# Patient Record
Sex: Male | Born: 1990 | Race: White | Hispanic: No | Marital: Married | Smoking: Former smoker
Health system: Southern US, Community
[De-identification: ages and names within clinical notes are randomized; demographics above are authoritative.]

## PROBLEM LIST (undated history)

## (undated) DIAGNOSIS — J302 Other seasonal allergic rhinitis: Secondary | ICD-10-CM

## (undated) DIAGNOSIS — J45909 Unspecified asthma, uncomplicated: Secondary | ICD-10-CM

## (undated) HISTORY — PX: MOUTH SURGERY: SHX715

---
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
## 2008-10-14 ENCOUNTER — Emergency Department (HOSPITAL_COMMUNITY): Admission: EM | Admit: 2008-10-14 | Discharge: 2008-10-14 | Payer: Self-pay | Admitting: Emergency Medicine

## 2010-11-03 LAB — BASIC METABOLIC PANEL
BUN: 7 mg/dL (ref 6–23)
CO2: 27 mEq/L (ref 19–32)
Calcium: 9.5 mg/dL (ref 8.4–10.5)
Chloride: 100 mEq/L (ref 96–112)
Creatinine, Ser: 1.04 mg/dL (ref 0.4–1.5)
GFR calc Af Amer: >60 mL/min (ref >60)
GFR calc non Af Amer: >60 mL/min (ref >60)
Glucose, Bld: 97 mg/dL (ref 70–99)
Potassium: 3.4 mEq/L — ABNORMAL LOW (ref 3.5–5.1)
Sodium: 137 mEq/L (ref 135–145)

## 2010-11-03 LAB — DIFFERENTIAL
Basophils Absolute: 0.2 10*3/uL — ABNORMAL HIGH (ref 0.0–0.1)
Basophils Relative: 1 % (ref 0–1)
Eosinophils Absolute: 0.4 10*3/uL (ref 0.0–0.7)
Eosinophils Relative: 4 % (ref 0–5)
Lymphocytes Relative: 23 % (ref 12–46)
Lymphs Abs: 2.5 10*3/uL (ref 0.7–4.0)
Monocytes Absolute: 0.7 10*3/uL (ref 0.1–1.0)
Monocytes Relative: 6 % (ref 3–12)
Neutro Abs: 7.2 10*3/uL (ref 1.7–7.7)
Neutrophils Relative %: 66 % (ref 43–77)

## 2010-11-03 LAB — CBC
HCT: 42 % (ref 39.0–52.0)
Hemoglobin: 14.4 g/dL (ref 13.0–17.0)
MCHC: 34.2 g/dL (ref 30.0–36.0)
MCV: 87.5 fL (ref 78.0–100.0)
Platelets: 215 10*3/uL (ref 150–400)
RBC: 4.8 MIL/uL (ref 4.22–5.81)
RDW: 13.9 % (ref 11.5–15.5)
WBC: 11 10*3/uL — ABNORMAL HIGH (ref 4.0–10.5)

## 2012-11-08 ENCOUNTER — Encounter (HOSPITAL_COMMUNITY): Payer: Self-pay

## 2012-11-08 ENCOUNTER — Emergency Department (HOSPITAL_COMMUNITY)
Admission: EM | Admit: 2012-11-08 | Discharge: 2012-11-08 | Disposition: A | Discharge status: Home / Self Care | Payer: Self-pay | Attending: Emergency Medicine | Admitting: Emergency Medicine

## 2012-11-08 ENCOUNTER — Emergency Department (HOSPITAL_COMMUNITY): Payer: Self-pay

## 2012-11-08 DIAGNOSIS — L299 Pruritus, unspecified: Secondary | ICD-10-CM | POA: Insufficient documentation

## 2012-11-08 DIAGNOSIS — J45901 Unspecified asthma with (acute) exacerbation: Secondary | ICD-10-CM | POA: Insufficient documentation

## 2012-11-08 DIAGNOSIS — R21 Rash and other nonspecific skin eruption: Secondary | ICD-10-CM | POA: Insufficient documentation

## 2012-11-08 DIAGNOSIS — F17200 Nicotine dependence, unspecified, uncomplicated: Secondary | ICD-10-CM | POA: Insufficient documentation

## 2012-11-08 DIAGNOSIS — Z79899 Other long term (current) drug therapy: Secondary | ICD-10-CM | POA: Insufficient documentation

## 2012-11-08 DIAGNOSIS — R0789 Other chest pain: Secondary | ICD-10-CM | POA: Insufficient documentation

## 2012-11-08 DIAGNOSIS — L237 Allergic contact dermatitis due to plants, except food: Secondary | ICD-10-CM

## 2012-11-08 DIAGNOSIS — R0602 Shortness of breath: Secondary | ICD-10-CM | POA: Insufficient documentation

## 2012-11-08 DIAGNOSIS — L255 Unspecified contact dermatitis due to plants, except food: Secondary | ICD-10-CM | POA: Insufficient documentation

## 2012-11-08 HISTORY — DX: Other seasonal allergic rhinitis: J30.2

## 2012-11-08 HISTORY — DX: Unspecified asthma, uncomplicated: J45.909

## 2012-11-08 MED ORDER — PREDNISONE 10 MG PO TABS: ORAL_TABLET | ORAL | Status: DC

## 2012-11-08 MED ORDER — ALBUTEROL SULFATE (5 MG/ML) 0.5% IN NEBU
5.0000 mg | INHALATION_SOLUTION | Freq: Once | RESPIRATORY_TRACT | Status: AC
  Administered 2012-11-08: 5 mg via RESPIRATORY_TRACT
  Filled 2012-11-08: qty 1

## 2012-11-08 MED ORDER — ALBUTEROL SULFATE HFA 108 (90 BASE) MCG/ACT IN AERS: 1.0000 | INHALATION_SPRAY | Freq: Four times a day (QID) | RESPIRATORY_TRACT | Status: DC | PRN

## 2012-11-08 MED ORDER — ALBUTEROL SULFATE HFA 108 (90 BASE) MCG/ACT IN AERS
2.0000 | INHALATION_SPRAY | RESPIRATORY_TRACT | Status: DC | PRN
  Administered 2012-11-08: 2 via RESPIRATORY_TRACT
  Filled 2012-11-08: qty 6.7

## 2012-11-08 MED ORDER — IPRATROPIUM BROMIDE 0.02 % IN SOLN
0.5000 mg | Freq: Once | RESPIRATORY_TRACT | Status: AC
  Administered 2012-11-08: 0.5 mg via RESPIRATORY_TRACT
  Filled 2012-11-08: qty 2.5

## 2012-11-08 MED ORDER — PREDNISONE 50 MG PO TABS
60.0000 mg | ORAL_TABLET | Freq: Once | ORAL | Status: AC
  Administered 2012-11-08: 60 mg via ORAL
  Filled 2012-11-08: qty 1

## 2012-11-08 NOTE — ED Notes (Signed)
C/o rash to forehead and face and rt arm. Also c/o increase SOB with allergies. Speaks complete sentences without difficulty. nad noted.

## 2012-11-12 NOTE — ED Provider Notes (Signed)
History     CSN: [id number]  Arrival date & time 11/08/12  7829   First MD Initiated Contact with Patient 11/08/12 [phone]      Chief Complaint  Patient presents with  . Rash  . Asthma    (Consider location/radiation/quality/duration/timing/severity/associated sxs/prior treatment) HPI Comments: Cole Fields is a 22 y.o. Male presenting with asthma flare which he believes is due to increased pollen, as he reports infrequent episodes of asthma and no longer has an albuterol inhaler.  Symptoms including wheezing, chest tightness and shortness of breath,  Gradually worsening over the past several days.  He also reports coming in contact with poison ivy and now has an itchy rash on his forehead.  He has tried no medications or treatments for either complaint and has found no alleviators.  Exertion makes his breathing worse. He denies chest pain.     The history is provided by the patient.    Past Medical History  Diagnosis Date  . Asthma   . Seasonal allergies     Past Surgical History  Procedure Laterality Date  . Mouth surgery      No family history on file.  History  Substance Use Topics  . Smoking status: Current Every Day Smoker  . Smokeless tobacco: Not on file  . Alcohol Use: Yes      Review of Systems  Constitutional: Negative for fever.  HENT: Negative for congestion, sore throat and neck pain.   Eyes: Negative.   Respiratory: Positive for chest tightness, shortness of breath and wheezing. Negative for cough.   Cardiovascular: Negative for chest pain.  Gastrointestinal: Negative for nausea and abdominal pain.  Genitourinary: Negative.   Musculoskeletal: Negative for joint swelling and arthralgias.  Skin: Positive for rash. Negative for wound.  Neurological: Negative for dizziness, weakness, light-headedness, numbness and headaches.  Psychiatric/Behavioral: Negative.     Allergies  Review of patient's allergies indicates no known allergies.  Home  Medications   Current Outpatient Rx  Name  Route  Sig  Dispense  Refill  . Tetrahydroz-Glyc-Hyprom-PEG (VISINE MAXIMUM REDNESS RELIEF OP)   Ophthalmic   Apply 1 drop to eye daily as needed (allergy relief).         Marland Kitchen albuterol (PROVENTIL HFA;VENTOLIN HFA) 108 (90 BASE) MCG/ACT inhaler   Inhalation   Inhale 1-2 puffs into the lungs every 6 (six) hours as needed for wheezing.   1 Inhaler   0   . predniSONE (DELTASONE) 10 MG tablet      6, 5, 4, 3, 2 then 1 tablet by mouth daily for 6 days total.   21 tablet   0     BP 142/103  Pulse 103  Temp(Src) 98.9 F (37.2 C)  Resp 22  Ht 5\' 8"  (1.727 m)  SpO2 93%  Physical Exam  Nursing note and vitals reviewed. Constitutional: He appears well-developed and well-nourished. No distress.  HENT:  Head: Normocephalic and atraumatic.  Eyes: Conjunctivae are normal.  Neck: Normal range of motion. Neck supple.  Cardiovascular: Normal rate, regular rhythm, normal heart sounds and intact distal pulses.   Pulmonary/Chest: Effort normal. He has wheezes. He has no rhonchi.  Expiratory wheezes throughout all lung fields with prolonged expirations.  Abdominal: Soft. Bowel sounds are normal. There is no tenderness.  Musculoskeletal: Normal range of motion. He exhibits no edema.  Neurological: He is alert.  Skin: Skin is warm and dry. Rash noted. Rash is vesicular.  Several patches of vesicular rash on forehead,  No drainage,  Vesicles are intact.    Psychiatric: He has a normal mood and affect.    ED Course  Procedures (including critical care time)  Pt given albuterol and atrovent neb with complete resolution of wheezing,  Lungs clear with adequate aeration.   Labs Reviewed - No data to display No results found.   1. Asthma attack   2. Contact dermatitis due to poison ivy       MDM  Pt placed on prednisone taper, prescribed albuterol mdi.  Pt no respiratory distress at time of dc.   Patients labs and/or radiological studies  were viewed and considered during the medical decision making and disposition process.         Burgess Amor, PA-C 11/12/12 2146

## 2012-11-14 NOTE — ED Provider Notes (Signed)
Medical screening examination/treatment/procedure(s) were performed by non-physician practitioner and as supervising physician I was immediately available for consultation/collaboration.   Ajia Chadderdon M Ingvald Theisen, DO 11/14/12 1652 

## 2014-09-16 ENCOUNTER — Emergency Department (HOSPITAL_COMMUNITY)
Admission: EM | Admit: 2014-09-16 | Discharge: 2014-09-16 | Disposition: A | Discharge status: Home / Self Care | Payer: Self-pay | Attending: Emergency Medicine | Admitting: Emergency Medicine

## 2014-09-16 ENCOUNTER — Encounter (HOSPITAL_COMMUNITY): Payer: Self-pay | Admitting: Emergency Medicine

## 2014-09-16 ENCOUNTER — Emergency Department (HOSPITAL_COMMUNITY): Payer: Self-pay

## 2014-09-16 DIAGNOSIS — S0990XA Unspecified injury of head, initial encounter: Secondary | ICD-10-CM | POA: Insufficient documentation

## 2014-09-16 DIAGNOSIS — S0081XA Abrasion of other part of head, initial encounter: Secondary | ICD-10-CM | POA: Insufficient documentation

## 2014-09-16 DIAGNOSIS — Z7952 Long term (current) use of systemic steroids: Secondary | ICD-10-CM | POA: Insufficient documentation

## 2014-09-16 DIAGNOSIS — S1083XA Contusion of other specified part of neck, initial encounter: Secondary | ICD-10-CM | POA: Insufficient documentation

## 2014-09-16 DIAGNOSIS — J45909 Unspecified asthma, uncomplicated: Secondary | ICD-10-CM | POA: Insufficient documentation

## 2014-09-16 DIAGNOSIS — S46012A Strain of muscle(s) and tendon(s) of the rotator cuff of left shoulder, initial encounter: Secondary | ICD-10-CM | POA: Insufficient documentation

## 2014-09-16 DIAGNOSIS — Y9389 Activity, other specified: Secondary | ICD-10-CM | POA: Insufficient documentation

## 2014-09-16 DIAGNOSIS — Z23 Encounter for immunization: Secondary | ICD-10-CM | POA: Insufficient documentation

## 2014-09-16 DIAGNOSIS — Z79899 Other long term (current) drug therapy: Secondary | ICD-10-CM | POA: Insufficient documentation

## 2014-09-16 DIAGNOSIS — T148XXA Other injury of unspecified body region, initial encounter: Secondary | ICD-10-CM

## 2014-09-16 DIAGNOSIS — Y998 Other external cause status: Secondary | ICD-10-CM | POA: Insufficient documentation

## 2014-09-16 DIAGNOSIS — Y92410 Unspecified street and highway as the place of occurrence of the external cause: Secondary | ICD-10-CM | POA: Insufficient documentation

## 2014-09-16 DIAGNOSIS — S46011A Strain of muscle(s) and tendon(s) of the rotator cuff of right shoulder, initial encounter: Secondary | ICD-10-CM | POA: Insufficient documentation

## 2014-09-16 DIAGNOSIS — R0789 Other chest pain: Secondary | ICD-10-CM

## 2014-09-16 DIAGNOSIS — S29011A Strain of muscle and tendon of front wall of thorax, initial encounter: Secondary | ICD-10-CM | POA: Insufficient documentation

## 2014-09-16 MED ORDER — TRAMADOL HCL 50 MG PO TABS: 50.0000 mg | ORAL_TABLET | Freq: Four times a day (QID) | ORAL | Status: DC | PRN

## 2014-09-16 MED ORDER — TETANUS-DIPHTH-ACELL PERTUSSIS 5-2.5-18.5 LF-MCG/0.5 IM SUSP
INTRAMUSCULAR | Status: AC
  Filled 2014-09-16: qty 0.5

## 2014-09-16 MED ORDER — TETANUS-DIPHTH-ACELL PERTUSSIS 5-2.5-18.5 LF-MCG/0.5 IM SUSP
0.5000 mL | Freq: Once | INTRAMUSCULAR | Status: AC
  Administered 2014-09-16: 0.5 mL via INTRAMUSCULAR

## 2014-09-16 MED ORDER — DIAZEPAM 5 MG PO TABS
5.0000 mg | ORAL_TABLET | Freq: Once | ORAL | Status: AC
  Administered 2014-09-16: 5 mg via ORAL
  Filled 2014-09-16: qty 1

## 2014-09-16 MED ORDER — OXYCODONE-ACETAMINOPHEN 5-325 MG PO TABS
2.0000 | ORAL_TABLET | Freq: Once | ORAL | Status: AC
  Administered 2014-09-16: 2 via ORAL
  Filled 2014-09-16: qty 2

## 2014-09-16 MED ORDER — NAPROXEN 500 MG PO TABS: 500.0000 mg | ORAL_TABLET | Freq: Two times a day (BID) | ORAL | Status: DC | PRN
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
## 2015-02-01 ENCOUNTER — Emergency Department (HOSPITAL_COMMUNITY)
Admission: EM | Admit: 2015-02-01 | Discharge: 2015-02-01 | Disposition: A | Discharge status: Home / Self Care | Payer: Self-pay | Attending: Emergency Medicine | Admitting: Emergency Medicine

## 2015-02-01 ENCOUNTER — Encounter (HOSPITAL_COMMUNITY): Payer: Self-pay | Admitting: Emergency Medicine

## 2015-02-01 DIAGNOSIS — Z7952 Long term (current) use of systemic steroids: Secondary | ICD-10-CM | POA: Insufficient documentation

## 2015-02-01 DIAGNOSIS — K088 Other specified disorders of teeth and supporting structures: Secondary | ICD-10-CM | POA: Insufficient documentation

## 2015-02-01 DIAGNOSIS — Z79899 Other long term (current) drug therapy: Secondary | ICD-10-CM | POA: Insufficient documentation

## 2015-02-01 DIAGNOSIS — R220 Localized swelling, mass and lump, head: Secondary | ICD-10-CM

## 2015-02-01 DIAGNOSIS — J45909 Unspecified asthma, uncomplicated: Secondary | ICD-10-CM | POA: Insufficient documentation

## 2015-02-01 DIAGNOSIS — K0889 Other specified disorders of teeth and supporting structures: Secondary | ICD-10-CM

## 2015-02-01 DIAGNOSIS — Z720 Tobacco use: Secondary | ICD-10-CM | POA: Insufficient documentation

## 2015-02-01 MED ORDER — TRAMADOL HCL 50 MG PO TABS: 50.0000 mg | ORAL_TABLET | Freq: Four times a day (QID) | ORAL | Status: DC | PRN

## 2015-02-01 MED ORDER — CLINDAMYCIN HCL 150 MG PO CAPS: 150.0000 mg | ORAL_CAPSULE | Freq: Four times a day (QID) | ORAL | Status: DC

## 2015-02-01 MED ORDER — TRAMADOL HCL 50 MG PO TABS
50.0000 mg | ORAL_TABLET | Freq: Once | ORAL | Status: AC
  Administered 2015-02-01: 50 mg via ORAL
  Filled 2015-02-01: qty 1

## 2015-02-01 MED ORDER — KETOROLAC TROMETHAMINE 60 MG/2ML IM SOLN
60.0000 mg | Freq: Once | INTRAMUSCULAR | Status: AC
  Administered 2015-02-01: 60 mg via INTRAMUSCULAR
  Filled 2015-02-01: qty 2

## 2015-02-01 NOTE — ED Notes (Signed)
MD at bedside. 

## 2015-02-01 NOTE — Discharge Instructions (Signed)
If you were given medicines take as directed.  If you are on coumadin or contraceptives realize their levels and effectiveness is altered by many different medicines.  If you have any reaction (rash, tongues swelling, other) to the medicines stop taking and see a physician.    If your blood pressure was elevated in the ER make sure you follow up for management with a primary doctor or return for chest pain, shortness of breath or stroke symptoms.  Please follow up as directed and return to the ER or see a physician for new or worsening symptoms.  Thank you. Filed Vitals:   02/01/15 0713  BP: 151/101  Pulse: 82  Temp: 97.8 F (36.6 C)  TempSrc: Oral  Resp: 17  Height: 5\' 8"  (1.727 m)  Weight: 315 lb (142.883 kg)  SpO2: 100%    Emergency Department Resource Guide 1) Find a Doctor and Pay Out of Pocket Although you won't have to find out who is covered by your insurance plan, it is a good idea to ask around and get recommendations. You will then need to call the office and see if the doctor you have chosen will accept you as a new patient and what types of options they offer for patients who are self-pay. Some doctors offer discounts or will set up payment plans for their patients who do not have insurance, but you will need to ask so you aren't surprised when you get to your appointment.  2) Contact Your Local Health Department Not all health departments have doctors that can see patients for sick visits, but many do, so it is worth a call to see if yours does. If you don't know where your local health department is, you can check in your phone book. The CDC also has a tool to help you locate your state's health department, and many state websites also have listings of all of their local health departments.  3) Find a Walk-in Clinic If your illness is not likely to be very severe or complicated, you may want to try a walk in clinic. These are popping up all over the country in pharmacies,  drugstores, and shopping centers. They're usually staffed by nurse practitioners or physician assistants that have been trained to treat common illnesses and complaints. They're usually fairly quick and inexpensive. However, if you have serious medical issues or chronic medical problems, these are probably not your best option.  No Primary Care Doctor: - Call Health Connect at  [phone] - they can help you locate a primary care doctor that  accepts your insurance, provides certain services, etc. - Physician Referral Service- [phone]  Chronic Pain Problems: Organization         Address  Phone   Notes  [facility]  [phone] Patients need to be referred by their primary care doctor.   Medication Assistance: Organization         Address  Phone   Notes  [facility] Medication [facility] [address] [place] [place], [place] [address] [phone] --Must be a resident of [facility] -- Must have NO insurance coverage whatsoever (no Medicaid/ Medicare, etc.) -- The pt. MUST have a primary care doctor that directs their care regularly and follows them in the community   [facility]  [phone]   [employer]  [phone]    Agencies that provide inexpensive medical care: Retail buyer  Notes  [facility]  [phone]   [facility]    [phone]   [facility] [address] [place], [place] [address] [phone]   [facility] [address], [place] [phone]   Planned Parenthood    [phone]   [facility]    [phone]   [facility]  [address], [place] Phone:  [phone], Fax:  [phone] Hours of Operation:  9 am - 6 pm, M-F.  Also accepts Medicaid/Medicare and self-pay.  [facility]  [address], [place] Phone:  [phone], Fax: [phone]. Hours of Operation:  8:30 am - 5:30 pm, M-F.  Also accepts Medicaid and self-pay.  [facility] [place] [address] [place] Phone: [phone]   [facility] [address] Natasha Bence [place], [place] [phone], Ext. [phone] Mondays & Thursdays: 7-9 AM.  First 15 patients are seen on a first come, first serve basis.    Medicaid-accepting [facility] Providers:  Organization         Address  Phone   Notes  [facility] [address], [place] [phone] Also accepts self-pay patients.  [facility] [address] Laurell Josephs [place], [place]  [phone]   [facility] [address], [place] [phone]   [facility] [address] [place] [phone]   Renaye Rakers [address], [place]   [phone] Only accepts [place] patients after they have their name applied to their card.   Self-Pay (no insurance) in [facility]:  Organization         Address  Phone   Notes  Sickle Cell Patients, [facility] [address] [place], [place] [phone]   [facility] [address] [place], [place] [phone]   [facility] [place]  [address], [place] [phone]   [facility]/Dr. Osei-Bonsu  [address] [place] or [address], [place] [phone] Phone number for both [place] and [place] locations is the same.  [facility]gent Medical and [facility] [address], [place] [phone]   [facility] [address] [place] or [address] [phone]   [facility] [address] [place] [phone], phone; [phone], fax Sees patients 1st and 3rd Saturday of every month.  Must not qualify for public  or private insurance (i.e. Medicaid, Medicare, [place] Health Choice, Veterans' Benefits)  Household income should be no more than 200% of the poverty level The clinic cannot treat you if you are pregnant or think you are pregnant  Sexually transmitted diseases are not treated at the clinic.    Dental Care: Organization         Address  Phone  Notes  [facility] Department of [facility] [address] [place], [place] [phone] Accepts children up to age 33 who are enrolled in [place] or [place] Health Choice; pregnant women with a Medicaid card; and children who have applied for Medicaid or [place] Health Choice, but were declined, whose parents can pay a reduced fee at time of service.  [facility]  Department of [facility]  [address], [place] [phone] Accepts children up to age 93 who are enrolled in [place] or [place] Health Choice; pregnant women with a Medicaid card; and children who have applied for Medicaid or [place] Health Choice, but were declined, whose parents can pay a reduced fee at time of service.  [facility]  [address] [place], [place] [phone] Patients are seen by appointment only. Walk-ins are not accepted. [facility] will see patients 53 years of age and older. Monday - Tuesday (8am-5pm) Most Wednesdays (8:30-5pm) $30 per visit, cash only  [facility]  [address], [facility] [phone] Patients are seen by appointment only. Walk-ins are not accepted. [facility] will see patients 75 years of age and older. One Wednesday Evening (Monthly: Volunteer Based).  $30 per visit, cash only  [employer]  [phone] for adults; Children under age 12, call Graduate Pediatric Dentistry at [phone]. Children aged 59-14, please call [phone] to request a pediatric application.  Dental services are provided in all areas of  dental care including fillings, crowns and bridges, complete and partial dentures, implants, gum treatment, root canals, and extractions. Preventive care is also provided. Treatment is provided to both adults and children. Patients are selected via a lottery and there is often a waiting list.   [facility] [address] [place]  [phone] [email or web address]   [facility] [address] [place], [place] [phone], Ext. [phone] Second and Fourth Thursday of each month, opens at 6:30 AM; Clinic ends at 9 AM.  Patients are seen on a first-come first-served basis, and a limited number are seen during each clinic.   [facility]  [address] [place], [place] [phone]   Eligibility Requirements You must have lived in [place], [place], or [place] counties for at least the last three months.   You cannot be eligible for state or federal sponsored [place], including [employer], [place], or [employer].   You generally cannot be eligible for healthcare insurance through your employer.    How to apply: Eligibility screenings are held every Tuesday and Wednesday afternoon from 1:00 pm until 4:00 pm. You do not need an appointment for the interview!  [facility] [address] [place], [place] [phone]   [facility] Health [facility]  [phone]   [facility] Health Department  [phone]   [facility]  [phone]    Behavioral Health Resources in the Community: Intensive Outpatient Programs Organization         Address  Phone  Notes  [facility] [address], [place], [place] [phone]   [facility] [address] [place], [place] [phone]   [facility] [address], [place], [place]  [phone]   [facility] [address],  [place], [place] [phone] or  [phone]   Substance Abuse Resources Organization         Address  Phone  Notes  [facility] Drug Services  [phone]   [facility]  [phone]   [facility]  [phone]   Floydene Flock  [phone]   Residential & Outpatient Substance Abuse Program  [phone]   Psychological Services Organization         Address  Phone  Notes  [facility]  [phone]- [phone]   [facility]  [phone]- [phone]   [facility] [address], [place] [phone] or [phone]    Mobile Crisis Teams Organization         Address  Phone  Notes  [facility], Mobile Crisis Care Unit  [phone]   [facility]  [address] [place], [place] [phone]   Doristine LocksSharon DeEsch [address] [place] [phone]    Self-Help/Support Groups Organization         Address  Phone             Notes  [facility] [place] - variety of support groups  [phone]- [phone] Call for more information  Narcotics Anonymous (NA), [facility] [address], [place]  2 meetings at this location   Statisticianesidential Treatment Programs Organization         Address  Phone  Notes  [facility] [address],    [place]  [phone]   [facility]  [address], [place] [phone], [place], [place] [phone]   [facility] [address], [place] [phone] Admissions: 8am-3pm M-F  [facility] [address]    [place], [place] [phone]   [facility] [address], [place], [place] [phone]   [facility] [address]  [place], [place] [phone]   [facility] - Intensive Outpatient [address], [place], [place] [phone]   [facility] ([facility] [address]  [place], [place] [phone] or [phone]   [facility]  [facility] Services ([facility]) [address] [place], [place] [phone] Accepts Medicaid  [facility] [address]  [place] [phone] Substance Abuse/Addiction Treatment   [facility] Behavioral Health Resources Organization         Address  Phone  Notes  [facility]  [phone]) [phone]   Angie FavaJulie Brannon, PhD [address] [place], [place]   [phone](336) [phone] or [phone](336) [phone]   [facility]   [address] [place] [address] [place], [place] [phone](336) [phone]   [facility] [address], [place], [place] [phone]) [phone] Insurance/Medicaid/sponsorship through [facility]  [facility] Families [address]                                    [place], [place] [phone]) [phone] Therapy/tele-psych/case  [facility] [address]   [place], [place] [phone](336) [phone]    Dr. Lolly MustacheArfeen  [phone]) [phone]   [facility]  [employer] [facility] 1) [address],  2) [address], [place] 3)  [address], [place] [phone](336) [phone](336) [phone]  [phone](336) [phone]   [facility] Child Abuse Hotline [phone](336) [phone] or [phone](336) [phone] (After Hours)

## 2015-02-01 NOTE — ED Notes (Signed)
Pt states that he woke up this morning with pain and swelling on top left of mouth.  States took Tylenol at 0400 and again at 0630 as well as "a few shots of liquor" for the pain.

## 2015-02-01 NOTE — ED Provider Notes (Signed)
CSN: [id number]     Arrival date & time 02/01/15  [id number] History   First MD Initiated Contact with Patient 02/01/15 0744     Chief Complaint  Patient presents with  . Oral Swelling     (Consider location/radiation/quality/duration/timing/severity/associated sxs/prior Treatment) HPI Comments: 24 year old male denies medical hx, no allergies to medicine (vomiting with ibuprofen), smoker presents with left upper tooth pain and facial swelling.  Tender to palpation, pt waiting to try and see a dentist.  No fevers The history is provided by the patient.    Past Medical History  Diagnosis Date  . Asthma   . Seasonal allergies    Past Surgical History  Procedure Laterality Date  . Mouth surgery     History reviewed. No pertinent family history. History  Substance Use Topics  . Smoking status: Current Every Day Smoker -- 0.50 packs/day    Types: Cigarettes  . Smokeless tobacco: Not on file  . Alcohol Use: Yes    Review of Systems  Constitutional: Negative for fever and chills.  HENT: Positive for dental problem and facial swelling.   Respiratory: Negative for shortness of breath.       Allergies  Ibuprofen  Home Medications   Prior to Admission medications   Medication Sig Start Date End Date Taking? Authorizing Provider  acetaminophen (TYLENOL) 500 MG tablet Take 1,000 mg by mouth every 8 (eight) hours as needed (pain).    Historical Provider, MD  albuterol (PROVENTIL HFA;VENTOLIN HFA) 108 (90 BASE) MCG/ACT inhaler Inhale 1-2 puffs into the lungs every 6 (six) hours as needed for wheezing. Patient not taking: Reported on 09/16/2014 11/08/12   Burgess AmorJulie Idol, PA-C  clindamycin (CLEOCIN) 150 MG capsule Take 1 capsule (150 mg total) by mouth every 6 (six) hours. 02/01/15   Blane OharaJoshua Deneen Slager, MD  loratadine (CLARITIN) 10 MG tablet Take 10 mg by mouth daily.    Historical Provider, MD  naproxen (NAPROSYN) 500 MG tablet Take 1 tablet (500 mg total) by mouth 2 (two) times daily as needed for  moderate pain. 09/16/14   Raeford RazorStephen Kohut, MD  predniSONE (DELTASONE) 10 MG tablet 6, 5, 4, 3, 2 then 1 tablet by mouth daily for 6 days total. Patient not taking: Reported on 09/16/2014 11/08/12   Burgess AmorJulie Idol, PA-C  traMADol (ULTRAM) 50 MG tablet Take 1 tablet (50 mg total) by mouth every 6 (six) hours as needed. 09/16/14   Raeford RazorStephen Kohut, MD  traMADol (ULTRAM) 50 MG tablet Take 1 tablet (50 mg total) by mouth every 6 (six) hours as needed. 02/01/15   Blane OharaJoshua Jaimie Pippins, MD   BP 151/101 mmHg  Pulse 82  Temp(Src) 97.8 F (36.6 C) (Oral)  Resp 17  Ht 5\' 8"  (1.727 m)  Wt 315 lb (142.883 kg)  BMI 47.91 kg/m2  SpO2 100% Physical Exam  Constitutional: He appears well-developed and well-nourished. No distress.  HENT:  Head: Normocephalic and atraumatic.  Tender left upper gingiva, very mild left facial swelling, no induration, neck supple, No trismus, uvular deviation, unilateral posterior pharyngeal edema or submandibular swelling.   Neck: Normal range of motion. Neck supple.  Pulmonary/Chest: Effort normal and breath sounds normal.  Nursing note and vitals reviewed.   ED Course  Procedures (including critical care time) Labs Review Labs Reviewed - No data to display  Imaging Review No results found.   EKG Interpretation None      MDM   Final diagnoses:  Pain, dental  Left facial swelling   Well appearing, no emergent ENT infection  on exam.   Pt tolerated toradol and tramadol well.  Results and differential diagnosis were discussed with the patient/parent/guardian. Xrays were independently reviewed by myself.  Close follow up outpatient was discussed, comfortable with the plan.   Medications  ketorolac (TORADOL) injection 60 mg (60 mg Intramuscular Given 02/01/15 0846)  traMADol (ULTRAM) tablet 50 mg (50 mg Oral Given 02/01/15 0847)    Filed Vitals:   02/01/15 0713 02/01/15 0848  BP: 151/101 133/101  Pulse: 82 65  Temp: 97.8 F (36.6 C) 97.3 F (36.3 C)  TempSrc: Oral Oral   Resp: 17 18  Height:  (1.727 m)   Weight: 315 lb (142.883 kg)   SpO2: 100% 96%    Final diagnoses:  Pain, dental  Left facial swelling       Blane Ohara, MD 02/02/15 2208

## 2015-07-29 ENCOUNTER — Encounter (HOSPITAL_COMMUNITY): Payer: Self-pay

## 2015-07-29 ENCOUNTER — Emergency Department (HOSPITAL_COMMUNITY)
Admission: EM | Admit: 2015-07-29 | Discharge: 2015-07-29 | Disposition: A | Discharge status: Home / Self Care | Payer: Self-pay | Attending: Emergency Medicine | Admitting: Emergency Medicine

## 2015-07-29 DIAGNOSIS — Z79899 Other long term (current) drug therapy: Secondary | ICD-10-CM | POA: Insufficient documentation

## 2015-07-29 DIAGNOSIS — K649 Unspecified hemorrhoids: Secondary | ICD-10-CM

## 2015-07-29 DIAGNOSIS — J45909 Unspecified asthma, uncomplicated: Secondary | ICD-10-CM | POA: Insufficient documentation

## 2015-07-29 DIAGNOSIS — K644 Residual hemorrhoidal skin tags: Secondary | ICD-10-CM | POA: Insufficient documentation

## 2015-07-29 DIAGNOSIS — F17210 Nicotine dependence, cigarettes, uncomplicated: Secondary | ICD-10-CM | POA: Insufficient documentation

## 2015-07-29 MED ORDER — HYDROCORTISONE ACETATE 25 MG RE SUPP: 25.0000 mg | Freq: Two times a day (BID) | RECTAL | Status: DC

## 2015-07-29 NOTE — ED Provider Notes (Signed)
CSN: [id number]     Arrival date & time 07/29/15  1216 History   First MD Initiated Contact with Patient 07/29/15 1340     Chief Complaint  Patient presents with  . Hemorrhoids     (Consider location/radiation/quality/duration/timing/severity/associated sxs/prior Treatment) HPI Comments: Patient is a 25 year old male who presents to the emergency department with a complaint of hemorrhoids.  The patient states that he has had problems with hemorrhoids a couple times a year for some time now. He states usually he can get them to "go back and". This time he states that the hemorrhoid was more resistant going in. The patient had been bothered with a hemorrhoid for approximately 4 days, when it ruptured. The patient states that it seems as though it was difficult to get it stop bleeding. And he presents to the emergency department because of bleeding. He states however after being in the examination room that it seems though the blood has mostly subsided. The pain is still present, but much improved after the hemorrhoid ruptured. The patient has not had any fever or chills reported.  The history is provided by the patient.    Past Medical History  Diagnosis Date  . Asthma   . Seasonal allergies    Past Surgical History  Procedure Laterality Date  . Mouth surgery     No family history on file. Social History  Substance Use Topics  . Smoking status: Current Every Day Smoker -- 0.50 packs/day    Types: Cigarettes  . Smokeless tobacco: None  . Alcohol Use: Yes     Comment: occ    Review of Systems  Genitourinary: Negative for dysuria, discharge, penile swelling, scrotal swelling, difficulty urinating, penile pain and testicular pain.       Rectal pain  All other systems reviewed and are negative.     Allergies  Ibuprofen  Home Medications   Prior to Admission medications   Medication Sig Start Date End Date Taking? Authorizing Provider  acetaminophen (TYLENOL) 500 MG tablet  Take 1,000 mg by mouth every 8 (eight) hours as needed (pain).   Yes Historical Provider, MD  albuterol (PROVENTIL HFA;VENTOLIN HFA) 108 (90 BASE) MCG/ACT inhaler Inhale 1-2 puffs into the lungs every 6 (six) hours as needed for wheezing. 11/08/12  Yes Burgess AmorJulie Idol, PA-C  loratadine (CLARITIN) 10 MG tablet Take 10 mg by mouth daily.   Yes Historical Provider, MD  phenylephrine-shark liver oil-mineral oil-petrolatum (PREPARATION H) 0.25-3-14-71.9 % rectal ointment Place 1 application rectally 4 (four) times daily as needed for hemorrhoids.   Yes Historical Provider, MD  clindamycin (CLEOCIN) 150 MG capsule Take 1 capsule (150 mg total) by mouth every 6 (six) hours. Patient not taking: Reported on 07/29/2015 02/01/15   Blane OharaJoshua Zavitz, MD  traMADol (ULTRAM) 50 MG tablet Take 1 tablet (50 mg total) by mouth every 6 (six) hours as needed. Patient not taking: Reported on 07/29/2015 02/01/15   Blane OharaJoshua Zavitz, MD   BP 140/75 mmHg  Pulse 107  Temp(Src) 97.8 F (36.6 C) (Oral)  Resp 24  Ht 5\' 5"  (1.651 m)  Wt 134.083 kg  BMI 49.19 kg/m2  SpO2 100% Physical Exam  Constitutional: He is oriented to person, place, and time. He appears well-developed and well-nourished.  Non-toxic appearance.  HENT:  Head: Normocephalic.  Right Ear: Tympanic membrane and external ear normal.  Left Ear: Tympanic membrane and external ear normal.  Eyes: EOM and lids are normal. Pupils are equal, round, and reactive to light.  Neck: Normal range  of motion. Neck supple. Carotid bruit is not present.  Cardiovascular: Normal rate, regular rhythm, normal heart sounds, intact distal pulses and normal pulses.   Pulmonary/Chest: Breath sounds normal. No respiratory distress.  Abdominal: Soft. Bowel sounds are normal. There is no tenderness. There is no guarding.  Genitourinary: Rectal exam shows external hemorrhoid. Rectal exam shows no fissure.     Musculoskeletal: Normal range of motion.  Lymphadenopathy:       Head (right side):  No submandibular adenopathy present.       Head (left side): No submandibular adenopathy present.    He has no cervical adenopathy.  Neurological: He is alert and oriented to person, place, and time. He has normal strength. No cranial nerve deficit or sensory deficit.  Skin: Skin is warm and dry.  Psychiatric: He has a normal mood and affect. His speech is normal.  Nursing note and vitals reviewed.   ED Course  Procedures (including critical care time) Labs Review Labs Reviewed - No data to display  Imaging Review No results found. I have personally reviewed and evaluated these images and lab results as part of my medical decision-making.   EKG Interpretation None      MDM  Vital signs within normal limits. There is no evidence of abscess or red streaking in the rectal area. The patient has an external hemorrhoid present. The bleeding that was previously noted has stopped. The patient is ambulatory in the room and hall with minimal discomfort.  I discussed warm tub soaks/sitz bath with the patient. I prescribed Anusol HC suppositories 2 or 3 times daily. The patient is also given a referral to surgery for additional evaluation and management if not improving. We discussed the need for high fiber diet, and stool softener until this issue resolves. The patient is in agreement with this discharge plan. The patient will return to the emergency department immediately if bleeding returns, or his not able to be controlled, or the pain intensifies concerning his hemorrhoid.    Final diagnoses:  Hemorrhoids, unspecified hemorrhoid type    *I have reviewed nursing notes, vital signs, and all appropriate lab and imaging results for this patient.[address], PA-C 07/30/15 2341  Doug Sou, MD 08/02/15 [phone]

## 2015-07-29 NOTE — ED Notes (Signed)
Pt reports noticed a hemorrhoid New Years Eve and reports it ruptured last night and pt bleeding.

## 2015-07-29 NOTE — Discharge Instructions (Signed)
. Please continue with high-fiber diet. Warm tub soaks 2 or 3 times daily. Anusol 2 or 3 times daily. Please see Dr. Lovell Sheehan for surgical evaluation and management of this issue. Hemorrhoids Hemorrhoids are swollen veins around the rectum or anus. There are two types of hemorrhoids:   Internal hemorrhoids. These occur in the veins just inside the rectum. They may poke through to the outside and become irritated and painful.  External hemorrhoids. These occur in the veins outside the anus and can be felt as a painful swelling or hard lump near the anus. CAUSES  Pregnancy.   Obesity.   Constipation or diarrhea.   Straining to have a bowel movement.   Sitting for long periods on the toilet.  Heavy lifting or other activity that caused you to strain.  Anal intercourse. SYMPTOMS   Pain.   Anal itching or irritation.   Rectal bleeding.   Fecal leakage.   Anal swelling.   One or more lumps around the anus.  DIAGNOSIS  Your caregiver may be able to diagnose hemorrhoids by visual examination. Other examinations or tests that may be performed include:   Examination of the rectal area with a gloved hand (digital rectal exam).   Examination of anal canal using a small tube (scope).   A blood test if you have lost a significant amount of blood.  A test to look inside the colon (sigmoidoscopy or colonoscopy). TREATMENT Most hemorrhoids can be treated at home. However, if symptoms do not seem to be getting better or if you have a lot of rectal bleeding, your caregiver may perform a procedure to help make the hemorrhoids get smaller or remove them completely. Possible treatments include:   Placing a rubber band at the base of the hemorrhoid to cut off the circulation (rubber band ligation).   Injecting a chemical to shrink the hemorrhoid (sclerotherapy).   Using a tool to burn the hemorrhoid (infrared light therapy).   Surgically removing the hemorrhoid  (hemorrhoidectomy).   Stapling the hemorrhoid to block blood flow to the tissue (hemorrhoid stapling).  HOME CARE INSTRUCTIONS   Eat foods with fiber, such as whole grains, beans, nuts, fruits, and vegetables. Ask your doctor about taking products with added fiber in them (fibersupplements).  Increase fluid intake. Drink enough water and fluids to keep your urine clear or pale yellow.   Exercise regularly.   Go to the bathroom when you have the urge to have a bowel movement. Do not wait.   Avoid straining to have bowel movements.   Keep the anal area dry and clean. Use wet toilet paper or moist towelettes after a bowel movement.   Medicated creams and suppositories may be used or applied as directed.   Only take over-the-counter or prescription medicines as directed by your caregiver.   Take warm sitz baths for 15-20 minutes, 3-4 times a day to ease pain and discomfort.   Place ice packs on the hemorrhoids if they are tender and swollen. Using ice packs between sitz baths may be helpful.   Put ice in a plastic bag.   Place a towel between your skin and the bag.   Leave the ice on for 15-20 minutes, 3-4 times a day.   Do not use a donut-shaped pillow or sit on the toilet for long periods. This increases blood pooling and pain.  SEEK MEDICAL CARE IF:  You have increasing pain and swelling that is not controlled by treatment or medicine.  You have uncontrolled bleeding.  You have difficulty or you are unable to have a bowel movement.  You have pain or inflammation outside the area of the hemorrhoids. MAKE SURE YOU:  Understand these instructions.  Will watch your condition.  Will get help right away if you are not doing well or get worse.   This information is not intended to replace advice given to you by your health care provider. Make sure you discuss any questions you have with your health care provider.   Document Released: 07/07/2000 Document  Revised: 06/26/2012 Document Reviewed: 05/14/2012 Elsevier Interactive Patient Education [employer]2016 [employer]
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
## 2017-04-20 ENCOUNTER — Encounter (HOSPITAL_COMMUNITY): Payer: Self-pay | Admitting: *Deleted

## 2017-04-20 ENCOUNTER — Emergency Department (HOSPITAL_COMMUNITY)
Admission: EM | Admit: 2017-04-20 | Discharge: 2017-04-20 | Disposition: A | Discharge status: Home / Self Care | Payer: Self-pay | Attending: Emergency Medicine | Admitting: Emergency Medicine

## 2017-04-20 DIAGNOSIS — Z5321 Procedure and treatment not carried out due to patient leaving prior to being seen by health care provider: Secondary | ICD-10-CM | POA: Insufficient documentation

## 2017-04-20 LAB — URINALYSIS, ROUTINE W REFLEX MICROSCOPIC
Bilirubin Urine: NEGATIVE
Glucose, UA: NEGATIVE mg/dL
Hgb urine dipstick: NEGATIVE
KETONES UR: NEGATIVE mg/dL
LEUKOCYTES UA: NEGATIVE
NITRITE: NEGATIVE
PROTEIN: NEGATIVE mg/dL
Specific Gravity, Urine: 1.011 (ref 1.005–1.030)
pH: 8 (ref 5.0–8.0)

## 2017-04-20 NOTE — ED Triage Notes (Signed)
Pt with epigastric abd pain that started today, N/V started yesterday. LBM yesterday and hard stool.

## 2017-04-20 NOTE — ED Notes (Signed)
Pt not in waiting room when called.   

## 2018-05-15 ENCOUNTER — Emergency Department (HOSPITAL_COMMUNITY)
Admission: EM | Admit: 2018-05-15 | Discharge: 2018-05-15 | Disposition: A | Discharge status: Home / Self Care | Payer: Self-pay | Attending: Emergency Medicine | Admitting: Emergency Medicine

## 2018-05-15 ENCOUNTER — Other Ambulatory Visit: Payer: Self-pay

## 2018-05-15 ENCOUNTER — Encounter (HOSPITAL_COMMUNITY): Payer: Self-pay | Admitting: Emergency Medicine

## 2018-05-15 DIAGNOSIS — J45909 Unspecified asthma, uncomplicated: Secondary | ICD-10-CM | POA: Insufficient documentation

## 2018-05-15 DIAGNOSIS — R000 Tachycardia, unspecified: Secondary | ICD-10-CM | POA: Insufficient documentation

## 2018-05-15 DIAGNOSIS — Z79899 Other long term (current) drug therapy: Secondary | ICD-10-CM | POA: Insufficient documentation

## 2018-05-15 DIAGNOSIS — T401X1A Poisoning by heroin, accidental (unintentional), initial encounter: Secondary | ICD-10-CM | POA: Insufficient documentation

## 2018-05-15 DIAGNOSIS — Z87891 Personal history of nicotine dependence: Secondary | ICD-10-CM | POA: Insufficient documentation

## 2018-05-15 MED ORDER — SODIUM CHLORIDE 0.9 % IV SOLN: Freq: Once | INTRAVENOUS | Status: DC

## 2018-05-15 NOTE — ED Notes (Signed)
Pt  Has flat affect, not making eye contract.  Family at bedside.

## 2018-05-15 NOTE — ED Provider Notes (Signed)
[facility] EMERGENCY DEPARTMENT Provider Note   CSN: [id number] Arrival date & time: 05/15/18  1445     History   Chief Complaint Chief Complaint  Patient presents with  . Drug Overdose    HPI Cole Fields is a 27 y.o. male.  He is brought in by EMS after an apparent overdose.  He says he is snorted some heroin today and also used Suboxone.  He was at his grandfathers doing some chores and he passed out.  He woke finding his family members around him and had had a urinary incontinence.  He did not receive any Narcan.  Currently he is awake and alert and denies any medical complaints.  He denies any other ingestions and denies he was trying to hurt himself.    The history is provided by the patient.  Drug Overdose  This is a new problem. The current episode started less than 1 hour ago. The problem has been resolved. Pertinent negatives include no chest pain, no abdominal pain, no headaches and no shortness of breath. Nothing aggravates the symptoms. Nothing relieves the symptoms. He has tried nothing for the symptoms. The treatment provided no relief.    Past Medical History:  Diagnosis Date  . Asthma   . Seasonal allergies     There are no active problems to display for this patient.   Past Surgical History:  Procedure Laterality Date  . MOUTH SURGERY          Home Medications    Prior to Admission medications   Medication Sig Start Date End Date Taking? Authorizing Provider  acetaminophen (TYLENOL) 500 MG tablet Take 1,000 mg by mouth every 8 (eight) hours as needed (pain).    [provider]  albuterol (PROVENTIL HFA;VENTOLIN HFA) 108 (90 BASE) MCG/ACT inhaler Inhale 1-2 puffs into the lungs every 6 (six) hours as needed for wheezing. 11/08/12   Burgess Amor, PA-C  clindamycin (CLEOCIN) 150 MG capsule Take 1 capsule (150 mg total) by mouth every 6 (six) hours. Patient not taking: Reported on 07/29/2015 02/01/15   Blane Ohara, MD  hydrocortisone  (ANUSOL-HC) 25 MG suppository Place 1 suppository (25 mg total) rectally 2 (two) times daily. 07/29/15   Ivery Quale, PA-C  loratadine (CLARITIN) 10 MG tablet Take 10 mg by mouth daily.    [provider]  phenylephrine-shark liver oil-mineral oil-petrolatum (PREPARATION H) 0.25-3-14-71.9 % rectal ointment Place 1 application rectally 4 (four) times daily as needed for hemorrhoids.    [provider]  traMADol (ULTRAM) 50 MG tablet Take 1 tablet (50 mg total) by mouth every 6 (six) hours as needed. Patient not taking: Reported on 07/29/2015 02/01/15   Blane Ohara, MD    Family History History reviewed. No pertinent family history.  Social History Social History   Tobacco Use  . Smoking status: Former Smoker    Packs/day: 0.50    Types: Cigarettes    Last attempt to quit: 12/18/2016    Years since quitting: 1.4  . Smokeless tobacco: Current User    Types: Chew  Substance Use Topics  . Alcohol use: Yes    Comment: occ  . Drug use: Yes    Types: Marijuana    Comment: Heroin     Allergies   Ibuprofen   Review of Systems Review of Systems  Constitutional: Negative for fever.  HENT: Negative for sore throat.   Eyes: Negative for visual disturbance.  Respiratory: Negative for shortness of breath.   Cardiovascular: Negative for chest  pain.  Gastrointestinal: Negative for abdominal pain.  Genitourinary: Negative for dysuria.  Musculoskeletal: Negative for neck pain.  Skin: Negative for rash.  Neurological: Negative for headaches.     Physical Exam Updated Vital Signs BP (!) 150/101   Pulse (!) 132   Temp 98 F (36.7 C) (Oral)   Resp 20   Ht 5\' 10"  (1.778 m)   Wt 100 kg   SpO2 96%   BMI 31.63 kg/m   Physical Exam  Constitutional: He is oriented to person, place, and time. He appears well-developed and well-nourished.  HENT:  Head: Normocephalic and atraumatic.  Eyes: Conjunctivae are normal.  Neck: Neck supple.  Cardiovascular: Regular  rhythm. Tachycardia present.  No murmur heard. Pulmonary/Chest: Effort normal and breath sounds normal. No respiratory distress.  Abdominal: Soft. There is no tenderness.  Musculoskeletal: He exhibits no edema.  Neurological: He is alert and oriented to person, place, and time. He has normal strength. No cranial nerve deficit or sensory deficit. Gait normal.  Skin: Skin is warm and dry.  Psychiatric: He has a normal mood and affect. He expresses no suicidal ideation.  Nursing note and vitals reviewed.    ED Treatments / Results  Labs (all labs ordered are listed, but only abnormal results are displayed) Labs Reviewed - No data to display  EKG EKG Interpretation  Date/Time:  Wednesday May 15 2018 14:46:16 EDT Ventricular Rate:  128 PR Interval:    QRS Duration: 99 QT Interval:  306 QTC Calculation: 447 R Axis:   113 Text Interpretation:  Sinus tachycardia Right axis deviation no prior to compare with Confirmed by Meridee Score [phone]) on 05/15/2018 2:49:03 PM   Radiology No results found.  Procedures Procedures (including critical care time)  Medications Ordered in ED Medications - No data to display   Initial Impression / Assessment and Plan / ED Course  I have reviewed the triage vital signs and the nursing notes.  Pertinent labs & imaging results that were available during my care of the patient were reviewed by me and considered in my medical decision making (see chart for details).  Clinical Course as of May 17 1023  Wed May 15, 2018  1610 Evaluated patient.  His heart rate remains elevated but he is awake and alert satting well on room air.   [MB]  1649 Patient is remained stable here for period of 2 hours.  I think he can be safely discharged.  We are giving him the number for some resources.   [MB]    Clinical Course User Index [MB] Terrilee Files, MD      Final Clinical Impressions(s) / ED Diagnoses   Final diagnoses:  Accidental overdose  of heroin, initial encounter [facility])  Tachycardia    ED Discharge Orders    None       Terrilee Files, MD 05/16/18 1025
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
## 2018-05-15 NOTE — Discharge Instructions (Signed)
You were evaluated in the emergency department for an accidental overdose of heroin.  Observed here.  At time and showed no signs of any worsening.  We are giving you resources if you are considering help with your heroin use.  Please return if any concerns.

## 2018-05-15 NOTE — ED Notes (Signed)
Tolerated 200 ml of water and 235 ml of spite.

## 2019-02-10 ENCOUNTER — Encounter (HOSPITAL_COMMUNITY): Payer: Self-pay

## 2019-02-10 ENCOUNTER — Other Ambulatory Visit: Payer: Self-pay

## 2019-02-10 ENCOUNTER — Emergency Department (HOSPITAL_COMMUNITY)
Admission: EM | Admit: 2019-02-10 | Discharge: 2019-02-10 | Payer: Self-pay | Attending: Emergency Medicine | Admitting: Emergency Medicine

## 2019-02-10 DIAGNOSIS — J45909 Unspecified asthma, uncomplicated: Secondary | ICD-10-CM | POA: Insufficient documentation

## 2019-02-10 DIAGNOSIS — F1910 Other psychoactive substance abuse, uncomplicated: Secondary | ICD-10-CM | POA: Insufficient documentation

## 2019-02-10 DIAGNOSIS — F17220 Nicotine dependence, chewing tobacco, uncomplicated: Secondary | ICD-10-CM | POA: Insufficient documentation

## 2019-02-10 DIAGNOSIS — Z79899 Other long term (current) drug therapy: Secondary | ICD-10-CM | POA: Insufficient documentation

## 2019-02-10 LAB — RAPID URINE DRUG SCREEN, HOSP PERFORMED
Amphetamines: POSITIVE — AB
Barbiturates: NOT DETECTED
Benzodiazepines: NOT DETECTED
Cocaine: NOT DETECTED
Opiates: NOT DETECTED
Tetrahydrocannabinol: POSITIVE — AB

## 2019-02-10 NOTE — Discharge Instructions (Addendum)
Return to ER for any worsening or concerning symptoms.

## 2019-02-10 NOTE — ED Provider Notes (Signed)
[facility] EMERGENCY DEPARTMENT Provider Note   CSN: [id number] Arrival date & time: 02/10/19  1606    History   Chief Complaint Chief Complaint  Patient presents with  . Medical Clearance    HPI Cole Fields is a 28 y.o. male.     28 year old male brought in by police officer for medical clearance.  Patient admits to snorting fentanyl today.  Patient denies any other drug use.  Patient states that he feels hot but otherwise feels fine.  Denies any chronic health problems.  No other complaints or concerns.     Past Medical History:  Diagnosis Date  . Asthma   . Seasonal allergies     There are no active problems to display for this patient.   Past Surgical History:  Procedure Laterality Date  . MOUTH SURGERY          Home Medications    Prior to Admission medications   Medication Sig Start Date End Date Taking? Authorizing Provider  acetaminophen (TYLENOL) 500 MG tablet Take 500-1,000 mg by mouth every 8 (eight) hours as needed for mild pain or moderate pain.     [provider]  loratadine (CLARITIN) 10 MG tablet Take 10 mg by mouth daily.    [provider]    Family History History reviewed. No pertinent family history.  Social History Social History   Tobacco Use  . Smoking status: Former Smoker    Packs/day: 0.50    Types: Cigarettes    Quit date: 12/18/2016    Years since quitting: 2.1  . Smokeless tobacco: Current User    Types: Chew  Substance Use Topics  . Alcohol use: Yes    Comment: occ  . Drug use: Yes    Types: Marijuana    Comment: Heroin     Allergies   Patient has no known allergies.   Review of Systems Review of Systems  Constitutional: Negative for fever.  Respiratory: Negative for shortness of breath.   Cardiovascular: Negative for chest pain.  Gastrointestinal: Negative for abdominal pain, nausea and vomiting.  Musculoskeletal: Negative for arthralgias and myalgias.  Skin: Negative for rash  and wound.  Allergic/Immunologic: Negative for immunocompromised state.  Psychiatric/Behavioral: Negative for confusion.  All other systems reviewed and are negative.    Physical Exam Updated Vital Signs BP (!) 139/98 (BP Location: Right Arm)   Pulse (!) 109   Temp 98.3 F (36.8 C) (Oral)   Resp 20   Ht 5\' 8"  (1.727 m)   Wt 99.8 kg   SpO2 97%   BMI 33.45 kg/m   Physical Exam Vitals signs and nursing note reviewed.  Constitutional:      General: He is not in acute distress.    Appearance: He is well-developed. He is not diaphoretic.  HENT:     Head: Normocephalic and atraumatic.     Mouth/Throat:     Mouth: Mucous membranes are moist.  Neck:     Musculoskeletal: Neck supple.  Cardiovascular:     Rate and Rhythm: Regular rhythm. Tachycardia present.     Pulses: Normal pulses.     Heart sounds: Normal heart sounds.  Pulmonary:     Effort: Pulmonary effort is normal.     Breath sounds: Normal breath sounds.  Skin:    General: Skin is warm and dry.     Findings: No rash.  Neurological:     Mental Status: He is alert and oriented to person, place, and time.  Psychiatric:  Behavior: Behavior normal.      ED Treatments / Results  Labs (all labs ordered are listed, but only abnormal results are displayed) Labs Reviewed  RAPID URINE DRUG SCREEN, HOSP PERFORMED - Abnormal; Notable for the following components:      Result Value   Amphetamines POSITIVE (*)    Tetrahydrocannabinol POSITIVE (*)    All other components within normal limits    EKG None  Radiology No results found.  Procedures Procedures (including critical care time)  Medications Ordered in ED Medications - No data to display   Initial Impression / Assessment and Plan / ED Course  I have reviewed the triage vital signs and the nursing notes.  Pertinent labs & imaging results that were available during my care of the patient were reviewed by me and considered in my medical decision  making (see chart for details).  Clinical Course as of Feb 09 1734  Mon Feb 10, 2019  1732 Male brought in by police officer for medical clearance.  Patient reports snorting fentanyl at 3 AM today.  Patient feels hot, he appears flushed, he is mildly tachycardic.  Lung sounds are clear patient is alert and maintaining his airway without difficulty.  Urine drug screen positive for amphetamines and marijuana.  Patient is cleared to be discharged with officer to jail.   [LM]    Clinical Course User Index [LM] Jeannie FendMurphy, Laura A, PA-C      Final Clinical Impressions(s) / ED Diagnoses   Final diagnoses:  Drug abuse [facility](HCC)    ED Discharge Orders    None       Alden HippMurphy, Laura A, PA-C 02/10/19 1735    Donnetta Hutchingook, Brian, MD 02/11/19 1650

## 2019-02-10 NOTE — ED Notes (Addendum)
Officer with pt at this time. Ambulated to [facility]. Pt requires medical clearance prior to going to jail

## 2019-02-10 NOTE — ED Triage Notes (Signed)
Pt states hitting fentanyl this morning around 3am, states he has hot clammy and sweating. LOC this afternoon and EMS administered Narcan. A&O x4

## 2019-05-16 ENCOUNTER — Other Ambulatory Visit: Payer: Self-pay

## 2019-05-16 ENCOUNTER — Emergency Department (HOSPITAL_COMMUNITY)
Admission: EM | Admit: 2019-05-16 | Discharge: 2019-05-17 | Disposition: A | Discharge status: Home / Self Care | Payer: Self-pay | Attending: Emergency Medicine | Admitting: Emergency Medicine

## 2019-05-16 ENCOUNTER — Encounter (HOSPITAL_COMMUNITY): Payer: Self-pay

## 2019-05-16 DIAGNOSIS — F1210 Cannabis abuse, uncomplicated: Secondary | ICD-10-CM | POA: Insufficient documentation

## 2019-05-16 DIAGNOSIS — T50901A Poisoning by unspecified drugs, medicaments and biological substances, accidental (unintentional), initial encounter: Secondary | ICD-10-CM | POA: Insufficient documentation

## 2019-05-16 DIAGNOSIS — F17228 Nicotine dependence, chewing tobacco, with other nicotine-induced disorders: Secondary | ICD-10-CM | POA: Insufficient documentation

## 2019-05-16 DIAGNOSIS — F1110 Opioid abuse, uncomplicated: Secondary | ICD-10-CM | POA: Insufficient documentation

## 2019-05-16 DIAGNOSIS — J45909 Unspecified asthma, uncomplicated: Secondary | ICD-10-CM | POA: Insufficient documentation

## 2019-05-16 NOTE — ED Triage Notes (Signed)
EMS called out for overdose, upon arrival pt was found unresponsive and given Narcan. PT alert and oriented x 4 upon arrival. PT says he last used around 7pm last night, just before dark, pt says he did not overdose, he just "passed out". Pt denies any pain or symptoms at this time.

## 2019-05-17 LAB — CBC WITH DIFFERENTIAL/PLATELET
Abs Immature Granulocytes: 0.07 10*3/uL (ref 0.00–0.07)
Basophils Absolute: 0 10*3/uL (ref 0.0–0.1)
Basophils Relative: 0 %
Eosinophils Absolute: 0 10*3/uL (ref 0.0–0.5)
Eosinophils Relative: 0 %
HCT: 38.6 % — ABNORMAL LOW (ref 39.0–52.0)
Hemoglobin: 12.5 g/dL — ABNORMAL LOW (ref 13.0–17.0)
Immature Granulocytes: 0 %
Lymphocytes Relative: 7 %
Lymphs Abs: 1.1 10*3/uL (ref 0.7–4.0)
MCH: 31.3 pg (ref 26.0–34.0)
MCHC: 32.4 g/dL (ref 30.0–36.0)
MCV: 96.5 fL (ref 80.0–100.0)
Monocytes Absolute: 0.8 10*3/uL (ref 0.1–1.0)
Monocytes Relative: 5 %
Neutro Abs: 14.3 10*3/uL — ABNORMAL HIGH (ref 1.7–7.7)
Neutrophils Relative %: 88 %
Platelets: 229 10*3/uL (ref 150–400)
RBC: 4 MIL/uL — ABNORMAL LOW (ref 4.22–5.81)
RDW: 12.6 % (ref 11.5–15.5)
WBC: 16.4 10*3/uL — ABNORMAL HIGH (ref 4.0–10.5)
nRBC: 0 % (ref 0.0–0.2)

## 2019-05-17 LAB — URINALYSIS, ROUTINE W REFLEX MICROSCOPIC
Bilirubin Urine: NEGATIVE
Glucose, UA: NEGATIVE mg/dL
Hgb urine dipstick: NEGATIVE
Ketones, ur: NEGATIVE mg/dL
Leukocytes,Ua: NEGATIVE
Nitrite: NEGATIVE
Protein, ur: NEGATIVE mg/dL
Specific Gravity, Urine: 1.016 (ref 1.005–1.030)
pH: 5 (ref 5.0–8.0)

## 2019-05-17 LAB — RAPID URINE DRUG SCREEN, HOSP PERFORMED
Amphetamines: NOT DETECTED
Barbiturates: NOT DETECTED
Benzodiazepines: NOT DETECTED
Cocaine: POSITIVE — AB
Opiates: NOT DETECTED
Tetrahydrocannabinol: POSITIVE — AB

## 2019-05-17 LAB — BASIC METABOLIC PANEL
Anion gap: 9 (ref 5–15)
BUN: 14 mg/dL (ref 6–20)
CO2: 26 mmol/L (ref 22–32)
Calcium: 8.4 mg/dL — ABNORMAL LOW (ref 8.9–10.3)
Chloride: 101 mmol/L (ref 98–111)
Creatinine, Ser: 1.1 mg/dL (ref 0.61–1.24)
GFR calc Af Amer: >60 mL/min (ref >60)
GFR calc non Af Amer: >60 mL/min (ref >60)
Glucose, Bld: 122 mg/dL — ABNORMAL HIGH (ref 70–99)
Potassium: 3.4 mmol/L — ABNORMAL LOW (ref 3.5–5.1)
Sodium: 136 mmol/L (ref 135–145)

## 2019-05-17 LAB — TROPONIN I (HIGH SENSITIVITY)
Troponin I (High Sensitivity): 24 ng/L — ABNORMAL HIGH (ref <18)
Troponin I (High Sensitivity): 21 ng/L — ABNORMAL HIGH (ref <18)

## 2019-05-17 LAB — D-DIMER, QUANTITATIVE: D-Dimer, Quant: <0.27 ug/mL-FEU (ref 0.00–0.50)

## 2019-05-17 NOTE — Discharge Instructions (Signed)
Stop using illicit drugs.  Establish care with a primary doctor.  You may establish some care for drug rehab at the attached list. Return to the ED if you develop chest pain, shortness of breath, any other concerns.

## 2019-05-17 NOTE — ED Provider Notes (Signed)
[facility] EMERGENCY DEPARTMENT Provider Note   CSN: [id number] Arrival date & time: 05/16/19  2326     History   Chief Complaint Chief Complaint  Patient presents with  . "passed out"    HPI Cole Fields is a 28 y.o. male with a history of asthma and seasonal allergy along with a history of heroine abuse stating he snorted heroine around 7 pm, then went outside to smoke a cigarette.  The next he was aware he had paramedics standing over him.  He was unresponsive prior to receiving Narcan by them. He denies overdosing , stating he must have passed out.  To his knowledge the heroine did not contain any other substances.  He denies any injury or pain at this time, denies headache, neck pain, n/v, chest pain, sob. Also denies suicidal ideation.    The history is provided by the patient.    Past Medical History:  Diagnosis Date  . Asthma   . Seasonal allergies     There are no active problems to display for this patient.   Past Surgical History:  Procedure Laterality Date  . MOUTH SURGERY          Home Medications    Prior to Admission medications   Medication Sig Start Date End Date Taking? Authorizing Provider  acetaminophen (TYLENOL) 500 MG tablet Take 500-1,000 mg by mouth every 8 (eight) hours as needed for mild pain or moderate pain.     [provider]  loratadine (CLARITIN) 10 MG tablet Take 10 mg by mouth daily.    [provider]    Family History No family history on file.  Social History Social History   Tobacco Use  . Smoking status: Former Smoker    Packs/day: 0.50    Types: Cigarettes    Quit date: 12/18/2016    Years since quitting: 2.4  . Smokeless tobacco: Current User    Types: Chew  Substance Use Topics  . Alcohol use: Yes    Comment: occ  . Drug use: Yes    Types: Marijuana    Comment: Heroin     Allergies   Patient has no known allergies.   Review of Systems Review of Systems  Constitutional: Negative  for fever.  HENT: Negative for congestion and sore throat.   Eyes: Negative.   Respiratory: Negative for chest tightness and shortness of breath.   Cardiovascular: Negative for chest pain.  Gastrointestinal: Negative for abdominal pain and nausea.  Genitourinary: Negative.   Musculoskeletal: Negative for arthralgias, joint swelling and neck pain.  Skin: Negative.  Negative for rash and wound.  Neurological: Positive for syncope. Negative for dizziness, weakness, light-headedness, numbness and headaches.  Psychiatric/Behavioral: Negative.      Physical Exam Updated Vital Signs BP 130/82 (BP Location: Right Arm)   Pulse (!) 119   Temp 98.9 F (37.2 C)   Resp (!) 22   Ht 5\' 8"  (1.727 m)   Wt 98 kg   SpO2 94%   BMI 32.84 kg/m   Physical Exam Vitals signs and nursing note reviewed.  Constitutional:      Appearance: He is well-developed.  HENT:     Head: Normocephalic and atraumatic.  Eyes:     Conjunctiva/sclera: Conjunctivae normal.     Pupils: Pupils are equal, round, and reactive to light.  Neck:     Musculoskeletal: Normal range of motion.  Cardiovascular:     Rate and Rhythm: Regular rhythm. Tachycardia present.  Heart sounds: Normal heart sounds.  Pulmonary:     Effort: Pulmonary effort is normal.     Breath sounds: Normal breath sounds. No wheezing.  Abdominal:     General: Bowel sounds are normal.     Palpations: Abdomen is soft.     Tenderness: There is no abdominal tenderness.  Musculoskeletal: Normal range of motion.  Skin:    General: Skin is warm and dry.  Neurological:     Mental Status: He is alert.      ED Treatments / Results  Labs (all labs ordered are listed, but only abnormal results are displayed) Labs Reviewed  CBC WITH DIFFERENTIAL/PLATELET  BASIC METABOLIC PANEL  URINALYSIS, ROUTINE W REFLEX MICROSCOPIC  RAPID URINE DRUG SCREEN, HOSP PERFORMED  D-DIMER, QUANTITATIVE (NOT AT [facility])  TROPONIN I (HIGH SENSITIVITY)    EKG EKG  Interpretation  Date/Time:  Saturday May 17 2019 00:13:04 EDT Ventricular Rate:  112 PR Interval:  162 QRS Duration: 96 QT Interval:  330 QTC Calculation: 450 R Axis:   71 Text Interpretation:  Sinus tachycardia Early repolarization Otherwise normal ECG No significant change was found Confirmed by Ezequiel Essex [id number]) on 05/17/2019 12:46:45 AM   Radiology No results found.  Procedures Procedures (including critical care time)  Medications Ordered in ED Medications - No data to display   Initial Impression / Assessment and Plan / ED Course  I have reviewed the triage vital signs and the nursing notes.  Pertinent labs & imaging results that were available during my care of the patient were reviewed by me and considered in my medical decision making (see chart for details).        Pt with reported accidental overdose of heroine.  Pending labs results at this time. Stable, remains tachycardic.  H/o same, last seen here one year ago with heroine overdose and syncope.    Discussed with Dr. Wyvonnia Dusky who will dispo pt once labs result.   Final Clinical Impressions(s) / ED Diagnoses   Final diagnoses:  None    ED Discharge Orders    None       Landis Martins 05/17/19 0158    Ezequiel Essex, MD 05/17/19 [phone]

## 2019-05-17 NOTE — ED Provider Notes (Addendum)
Patient brought in by EMS with loss of consciousness after snorting heroin around 7 PM.  He remembers snorting it and going outside to smoke.  The next thing he knows the paramedics were standing over him.  He did receive Narcan.  He denies any suicidal intent.  Denies hitting his head. Denies any chest pain or shortness of breath.  No distress.  No evidence of head trauma.  Tachycardic to the 110s.  EKG shows diffuse ST and J-point elevation similar to previous.  UDS positive for cocaine as well as THC.  Negative for opiates.  Troponin mildly elevated 24.  D-dimer negative.  Patient remains chest pain-free.  Troponin has improved to 21 on recheck.  Repeat EKG shows ST elevation of J-point elevation inferior laterally.  This was discussed with cardiology fellow Dr. Emilio Aspen who reviewed images.  He feels EKG initially at 1213 was consistent with pericarditis.  Repeat EKG showed 1 mm of J-point elevation inferolaterally without reciprocal change. D/w Dr. Burt Knack of interventional cardiology who agrees likely early repolarization and no signs of injury current.   Patient has no chest pain currently and never did have chest pain. Dr. Emilio Aspen feels that EKG is likely due to early repolarization does not recommend further work-up especially with minimal troponin elevation which are downtrending.  Patient is cocaine positive but denies ever having chest pain. Low suspicion for ACS or aortic dissection.  EKG was obtained to evaluate for arrhythmia in setting of syncope and patient never did have chest pain.   Patient observed in the ED without need for additional Narcan.  He denies any suicide attempts.  He is cautioned to cease all illicit drug use. Resources given.  CRITICAL CARE Performed by: Ezequiel Essex Total critical care time: 35 minutes Critical care time was exclusive of separately billable procedures and treating other patients. Critical care was necessary to treat or prevent imminent or  life-threatening deterioration. Critical care was time spent personally by me on the following activities: development of treatment plan with patient and/or surrogate as well as nursing, discussions with consultants, evaluation of patient's response to treatment, examination of patient, obtaining history from patient or surrogate, ordering and performing treatments and interventions, ordering and review of laboratory studies, ordering and review of radiographic studies, pulse oximetry and re-evaluation of patient's condition.    Ezequiel Essex, MD 05/17/19 6073    Ezequiel Essex, MD 05/17/19 [phone]

## 2019-05-17 NOTE — ED Notes (Signed)
Pt ambulated to restroom without assistance or complication. 

## 2019-05-17 NOTE — ED Notes (Signed)
Pt tolerated PO Fluids without complication 

## 2020-08-26 ENCOUNTER — Other Ambulatory Visit: Payer: Self-pay

## 2020-08-26 ENCOUNTER — Emergency Department (HOSPITAL_COMMUNITY)
Admission: EM | Admit: 2020-08-26 | Discharge: 2020-08-27 | Disposition: A | Discharge status: Home / Self Care | Payer: Self-pay | Attending: Emergency Medicine | Admitting: Emergency Medicine

## 2020-08-26 ENCOUNTER — Encounter (HOSPITAL_COMMUNITY): Payer: Self-pay

## 2020-08-26 DIAGNOSIS — R000 Tachycardia, unspecified: Secondary | ICD-10-CM | POA: Insufficient documentation

## 2020-08-26 DIAGNOSIS — Z87891 Personal history of nicotine dependence: Secondary | ICD-10-CM | POA: Insufficient documentation

## 2020-08-26 DIAGNOSIS — J45909 Unspecified asthma, uncomplicated: Secondary | ICD-10-CM | POA: Insufficient documentation

## 2020-08-26 DIAGNOSIS — T40601A Poisoning by unspecified narcotics, accidental (unintentional), initial encounter: Secondary | ICD-10-CM | POA: Insufficient documentation

## 2020-08-26 MED ORDER — SODIUM CHLORIDE 0.9 % IV BOLUS
1000.0000 mL | Freq: Once | INTRAVENOUS | Status: AC
  Administered 2020-08-27: 1000 mL via INTRAVENOUS

## 2020-08-26 NOTE — ED Triage Notes (Signed)
PT arrives via EMS for a potential drug overdose. EMS states that the fire department found him to be unresponsive and gave him 4 of narcan. Pt states "I took two bumps and woke up to EMS over me".   Pt is alert and oriented at this time

## 2020-08-26 NOTE — ED Provider Notes (Signed)
[facility]-EMERGENCY DEPT [facility] Emergency Department Provider Note MRN:  [id number]  Arrival date & time: 08/27/20     Chief Complaint   Drug Overdose   History of Present Illness   Cole Fields is a 30 y.o. year-old male with a history of asthma presenting to the ED with chief complaint of drug overdose.  Patient admits to using fentanyl and then waking up to EMS.  Was somnolent, inadequately breathing, required bag-valve-mask ventilation, Narcan in the field.  Doing well at this time with no complaints.  Review of Systems  A complete 10 system review of systems was obtained and all systems are negative except as noted in the HPI and PMH.   Patient's Health History    Past Medical History:  Diagnosis Date  . Asthma   . Seasonal allergies     Past Surgical History:  Procedure Laterality Date  . MOUTH SURGERY      History reviewed. No pertinent family history.  Social History   Socioeconomic History  . Marital status: Married    Spouse name: Not on file  . Number of children: Not on file  . Years of education: Not on file  . Highest education level: Not on file  Occupational History  . Not on file  Tobacco Use  . Smoking status: Former Smoker    Packs/day: 0.50    Types: Cigarettes    Quit date: 12/18/2016    Years since quitting: 3.6  . Smokeless tobacco: Current User    Types: Chew  Substance and Sexual Activity  . Alcohol use: Yes    Comment: occ  . Drug use: Yes    Types: Marijuana    Comment: Heroin  . Sexual activity: Not on file  Other Topics Concern  . Not on file  Social History Narrative  . Not on file   Social Determinants of Health   Financial Resource Strain: Not on file  Food Insecurity: Not on file  Transportation Needs: Not on file  Physical Activity: Not on file  Stress: Not on file  Social Connections: Not on file  Intimate Partner Violence: Not on file     Physical Exam   Vitals:   08/27/20 0200 08/27/20 0230  BP:  112/67 110/70  Pulse: 97 84  Resp: 13 15  Temp:    SpO2: 93% 96%    CONSTITUTIONAL: Well-appearing, NAD NEURO:  Alert and oriented x 3, no focal deficits EYES:  eyes equal and reactive ENT/NECK:  no LAD, no JVD CARDIO: Tachycardic rate, well-perfused, normal S1 and S2 PULM:  CTAB no wheezing or rhonchi GI/GU:  normal bowel sounds, non-distended, non-tender MSK/SPINE:  No gross deformities, no edema SKIN:  no rash, atraumatic PSYCH:  Appropriate speech and behavior  *Additional and/or pertinent findings included in MDM below  Diagnostic and Interventional Summary    EKG Interpretation  Date/Time:  Thursday August 26 2020 23:53:13 EST Ventricular Rate:  105 PR Interval:    QRS Duration: 105 QT Interval:  340 QTC Calculation: 450 R Axis:   77 Text Interpretation: Sinus tachycardia Inferior infarct, acute (LCx) ST elevation, consider anterolateral injury Confirmed by Kennis Carina [phone]) on 08/26/2020 11:56:15 PM      Labs Reviewed  CBC - Abnormal; Notable for the following components:      Result Value   RBC 3.93 (*)    Hemoglobin 12.6 (*)    HCT 38.6 (*)    All other components within normal limits  COMPREHENSIVE METABOLIC PANEL - Abnormal;  Notable for the following components:   Glucose, Bld 66 (*)    Calcium 8.5 (*)    Total Protein 6.3 (*)    All other components within normal limits  ACETAMINOPHEN LEVEL - Abnormal; Notable for the following components:   Acetaminophen (Tylenol), Serum <10 (*)    All other components within normal limits  SALICYLATE LEVEL - Abnormal; Notable for the following components:   Salicylate Lvl <7.0 (*)    All other components within normal limits  ETHANOL    No orders to display    Medications  sodium chloride 0.9 % bolus 1,000 mL (0 mLs Intravenous Stopped 08/27/20 0120)     Procedures  /  Critical Care Procedures  ED Course and Medical Decision Making  I have reviewed the triage vital signs, the nursing notes, and pertinent  available records from the EMR.  Listed above are laboratory and imaging tests that I personally ordered, reviewed, and interpreted and then considered in my medical decision making (see below for details).  We will need a few hours of monitoring after opioid overdose.  Patient is tachycardic but otherwise well-appearing, minimal complaints. Clinical Course as of 08/27/20 0307  Thu Aug 26, 2020  2357 Ekg with st elevations however no chest pain and prior ekg appears similar, likely BER, will provide fluids and repeat. [MB]    Clinical Course User Index [MB] Sabas Sous, MD     No resedation, continues to look and feel well after multiple hours, labs reassuring, appropriate for discharge.  Elmer Sow. Pilar Plate, MD [facility] Emergency Medicine [facility] [email or web address]  Final Clinical Impressions(s) / ED Diagnoses     ICD-10-CM   1. Opiate overdose, accidental or unintentional, initial encounter [facility])  T40.601A     ED Discharge Orders    None       Discharge Instructions Discussed with and Provided to Patient:     Discharge Instructions     You were evaluated in the Emergency Department and after careful evaluation, we did not find any emergent condition requiring admission or further testing in the hospital.  Your exam/testing today was overall reassuring.  Please return to the Emergency Department if you experience any worsening of your condition.  Thank you for allowing [place] to be a part of your care.        Sabas Sous, MD 08/27/20 [phone]

## 2020-08-27 LAB — COMPREHENSIVE METABOLIC PANEL
ALT: 17 U/L (ref 0–44)
AST: 27 U/L (ref 15–41)
Albumin: 3.6 g/dL (ref 3.5–5.0)
Alkaline Phosphatase: 54 U/L (ref 38–126)
Anion gap: 8 (ref 5–15)
BUN: 15 mg/dL (ref 6–20)
CO2: 26 mmol/L (ref 22–32)
Calcium: 8.5 mg/dL — ABNORMAL LOW (ref 8.9–10.3)
Chloride: 101 mmol/L (ref 98–111)
Creatinine, Ser: 1.02 mg/dL (ref 0.61–1.24)
GFR, Estimated: >60 mL/min (ref >60)
Glucose, Bld: 66 mg/dL — ABNORMAL LOW (ref 70–99)
Potassium: 4.5 mmol/L (ref 3.5–5.1)
Sodium: 135 mmol/L (ref 135–145)
Total Bilirubin: 1.1 mg/dL (ref 0.3–1.2)
Total Protein: 6.3 g/dL — ABNORMAL LOW (ref 6.5–8.1)

## 2020-08-27 LAB — CBC
HCT: 38.6 % — ABNORMAL LOW (ref 39.0–52.0)
Hemoglobin: 12.6 g/dL — ABNORMAL LOW (ref 13.0–17.0)
MCH: 32.1 pg (ref 26.0–34.0)
MCHC: 32.6 g/dL (ref 30.0–36.0)
MCV: 98.2 fL (ref 80.0–100.0)
Platelets: 201 10*3/uL (ref 150–400)
RBC: 3.93 MIL/uL — ABNORMAL LOW (ref 4.22–5.81)
RDW: 12 % (ref 11.5–15.5)
WBC: 9.8 10*3/uL (ref 4.0–10.5)
nRBC: 0 % (ref 0.0–0.2)

## 2020-08-27 LAB — SALICYLATE LEVEL: Salicylate Lvl: <7 mg/dL — ABNORMAL LOW (ref 7.0–30.0)

## 2020-08-27 LAB — ACETAMINOPHEN LEVEL: Acetaminophen (Tylenol), Serum: <10 ug/mL — ABNORMAL LOW (ref 10–30)

## 2020-08-27 LAB — ETHANOL: Alcohol, Ethyl (B): <10 mg/dL (ref <10)

## 2020-08-27 NOTE — Discharge Instructions (Addendum)
You were evaluated in the Emergency Department and after careful evaluation, we did not find any emergent condition requiring admission or further testing in the hospital.  Your exam/testing today was overall reassuring.  Please return to the Emergency Department if you experience any worsening of your condition.  Thank you for allowing us to be a part of your care.  

## 2021-03-12 ENCOUNTER — Other Ambulatory Visit: Payer: Self-pay

## 2021-03-12 ENCOUNTER — Emergency Department (HOSPITAL_COMMUNITY)
Admission: EM | Admit: 2021-03-12 | Discharge: 2021-03-12 | Disposition: A | Discharge status: Home / Self Care | Payer: Self-pay | Attending: Emergency Medicine | Admitting: Emergency Medicine

## 2021-03-12 DIAGNOSIS — T50901A Poisoning by unspecified drugs, medicaments and biological substances, accidental (unintentional), initial encounter: Secondary | ICD-10-CM

## 2021-03-12 DIAGNOSIS — R000 Tachycardia, unspecified: Secondary | ICD-10-CM | POA: Insufficient documentation

## 2021-03-12 DIAGNOSIS — Z87891 Personal history of nicotine dependence: Secondary | ICD-10-CM | POA: Insufficient documentation

## 2021-03-12 DIAGNOSIS — J45909 Unspecified asthma, uncomplicated: Secondary | ICD-10-CM | POA: Insufficient documentation

## 2021-03-12 DIAGNOSIS — Y900 Blood alcohol level of less than 20 mg/100 ml: Secondary | ICD-10-CM | POA: Insufficient documentation

## 2021-03-12 DIAGNOSIS — T402X1A Poisoning by other opioids, accidental (unintentional), initial encounter: Secondary | ICD-10-CM | POA: Insufficient documentation

## 2021-03-12 LAB — CBC WITH DIFFERENTIAL/PLATELET
Abs Immature Granulocytes: 0.07 10*3/uL (ref 0.00–0.07)
Basophils Absolute: 0 10*3/uL (ref 0.0–0.1)
Basophils Relative: 0 %
Eosinophils Absolute: 0 10*3/uL (ref 0.0–0.5)
Eosinophils Relative: 0 %
HCT: 39.6 % (ref 39.0–52.0)
Hemoglobin: 13.4 g/dL (ref 13.0–17.0)
Immature Granulocytes: 1 %
Lymphocytes Relative: 10 %
Lymphs Abs: 1.2 10*3/uL (ref 0.7–4.0)
MCH: 31.7 pg (ref 26.0–34.0)
MCHC: 33.8 g/dL (ref 30.0–36.0)
MCV: 93.6 fL (ref 80.0–100.0)
Monocytes Absolute: 0.8 10*3/uL (ref 0.1–1.0)
Monocytes Relative: 6 %
Neutro Abs: 10.2 10*3/uL — ABNORMAL HIGH (ref 1.7–7.7)
Neutrophils Relative %: 83 %
Platelets: 197 10*3/uL (ref 150–400)
RBC: 4.23 MIL/uL (ref 4.22–5.81)
RDW: 12.4 % (ref 11.5–15.5)
WBC: 12.3 10*3/uL — ABNORMAL HIGH (ref 4.0–10.5)
nRBC: 0 % (ref 0.0–0.2)

## 2021-03-12 LAB — COMPREHENSIVE METABOLIC PANEL
ALT: 23 U/L (ref 0–44)
AST: 23 U/L (ref 15–41)
Albumin: 4.4 g/dL (ref 3.5–5.0)
Alkaline Phosphatase: 56 U/L (ref 38–126)
Anion gap: 5 (ref 5–15)
BUN: 17 mg/dL (ref 6–20)
CO2: 27 mmol/L (ref 22–32)
Calcium: 8.3 mg/dL — ABNORMAL LOW (ref 8.9–10.3)
Chloride: 106 mmol/L (ref 98–111)
Creatinine, Ser: 1.21 mg/dL (ref 0.61–1.24)
GFR, Estimated: >60 mL/min (ref >60)
Glucose, Bld: 100 mg/dL — ABNORMAL HIGH (ref 70–99)
Potassium: 4.2 mmol/L (ref 3.5–5.1)
Sodium: 138 mmol/L (ref 135–145)
Total Bilirubin: 0.6 mg/dL (ref 0.3–1.2)
Total Protein: 7.2 g/dL (ref 6.5–8.1)

## 2021-03-12 LAB — RAPID URINE DRUG SCREEN, HOSP PERFORMED
Amphetamines: POSITIVE — AB
Barbiturates: NOT DETECTED
Benzodiazepines: NOT DETECTED
Cocaine: NOT DETECTED
Opiates: NOT DETECTED
Tetrahydrocannabinol: POSITIVE — AB

## 2021-03-12 LAB — ACETAMINOPHEN LEVEL: Acetaminophen (Tylenol), Serum: <10 ug/mL — ABNORMAL LOW (ref 10–30)

## 2021-03-12 LAB — ETHANOL: Alcohol, Ethyl (B): <10 mg/dL (ref <10)

## 2021-03-12 NOTE — ED Notes (Signed)
Pt alert and oriented on cell phone

## 2021-03-12 NOTE — ED Provider Notes (Signed)
[facility] EMERGENCY DEPARTMENT Provider Note   CSN: [id number] Arrival date & time: 03/12/21  1809     History Chief Complaint  Patient presents with   Drug Overdose    Cole Fields is a 30 y.o. male.  Patient brought into the by EMS.  Patient thinks he was taking 30 mg Roxicodone and family states that he passed out per fire station oxygen saturation was 86%.  Patient was given some bivalve mask.  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  Patient was given IM Narcan by family and 2 mg of intranasal by fire department.  Patient arrived here alert and oriented.  Denied any suicidal homicidal ideations or any intent to harm himself.  Patient denies any chest pain any shortness of breath any abdominal pain and nausea or vomiting.  Patient denies any fevers.      Past Medical History:  Diagnosis Date   Asthma    Seasonal allergies     There are no problems to display for this patient.   Past Surgical History:  Procedure Laterality Date   MOUTH SURGERY         No family history on file.  Social History   Tobacco Use   Smoking status: Former    Packs/day: 0.50    Types: Cigarettes    Quit date: 12/18/2016    Years since quitting: 4.2   Smokeless tobacco: Current    Types: Chew  Substance Use Topics   Alcohol use: Yes    Comment: occ   Drug use: Yes    Types: Marijuana    Comment: Heroin    Home Medications Prior to Admission medications   Not on File    Allergies    Patient has no known allergies.  Review of Systems   Review of Systems  Constitutional:  Negative for chills and fever.  HENT:  Negative for ear pain and sore throat.   Eyes:  Negative for pain and visual disturbance.  Respiratory:  Negative for cough and shortness of breath.   Cardiovascular:  Negative for chest pain and palpitations.  Gastrointestinal:  Negative for abdominal pain and vomiting.  Genitourinary:  Negative for dysuria and hematuria.  Musculoskeletal:   Negative for arthralgias and back pain.  Skin:  Negative for color change and rash.  Neurological:  Negative for seizures and syncope.  All other systems reviewed and are negative.  Physical Exam Updated Vital Signs BP 120/79   Pulse (!) 105   Temp 98.7 F (37.1 C) (Oral)   Resp 12   Ht 1.702 m (5\' 7" )   Wt 100 kg   SpO2 99%   BMI 34.53 kg/m   Physical Exam Vitals and nursing note reviewed.  Constitutional:      Appearance: Normal appearance. He is well-developed.  HENT:     Head: Normocephalic and atraumatic.  Eyes:     Extraocular Movements: Extraocular movements intact.     Conjunctiva/sclera: Conjunctivae normal.     Pupils: Pupils are equal, round, and reactive to light.  Cardiovascular:     Rate and Rhythm: Regular rhythm. Tachycardia present.     Heart sounds: Normal heart sounds. No murmur heard.   No friction rub.  Pulmonary:     Effort: Pulmonary effort is normal. No respiratory distress.     Breath sounds: Normal breath sounds.  Abdominal:     Palpations: Abdomen is soft.     Tenderness: There is no abdominal tenderness.  Musculoskeletal:  General: Normal range of motion.     Cervical back: Normal range of motion and neck supple.  Skin:    General: Skin is warm and dry.     Capillary Refill: Capillary refill takes less than 2 seconds.  Neurological:     General: No focal deficit present.     Mental Status: He is alert and oriented to person, place, and time.     Cranial Nerves: No cranial nerve deficit.     Sensory: No sensory deficit.     Motor: No weakness.    ED Results / Procedures / Treatments   Labs (all labs ordered are listed, but only abnormal results are displayed) Labs Reviewed  RAPID URINE DRUG SCREEN, HOSP PERFORMED - Abnormal; Notable for the following components:      Result Value   Amphetamines POSITIVE (*)    Tetrahydrocannabinol POSITIVE (*)    All other components within normal limits  COMPREHENSIVE METABOLIC PANEL -  Abnormal; Notable for the following components:   Glucose, Bld 100 (*)    Calcium 8.3 (*)    All other components within normal limits  CBC WITH DIFFERENTIAL/PLATELET - Abnormal; Notable for the following components:   WBC 12.3 (*)    Neutro Abs 10.2 (*)    All other components within normal limits  ACETAMINOPHEN LEVEL - Abnormal; Notable for the following components:   Acetaminophen (Tylenol), Serum <10 (*)    All other components within normal limits  ETHANOL    EKG EKG Interpretation  Date/Time:  Saturday March 12 2021 19:51:27 EDT Ventricular Rate:  107 PR Interval:  177 QRS Duration: 101 QT Interval:  330 QTC Calculation: 441 R Axis:   82 Text Interpretation: Sinus tachycardia ST elevation suggests acute pericarditis No significant change since last tracing Confirmed by Vanetta Mulders [phone]) on 03/12/2021 9:06:31 PM  Radiology No results found.  Procedures Procedures   Medications Ordered in ED Medications - No data to display  ED Course  I have reviewed the triage vital signs and the nursing notes.  Pertinent labs & imaging results that were available during my care of the patient were reviewed by me and considered in my medical decision making (see chart for details).    MDM Rules/Calculators/A&P                           Patient observed here has remained alert and oriented.  Patient denies any suicidal homicidal ideation.  Sounds as if patient may have had fentanyl in his system could have been the oxycodone.  Opiate urine drug screen was negative.  No evidence of any significant Tylenol overdose.  Liver function test are normal.  Tylenol level was not elevated.  Alcohol was not elevated.  Patient stable for discharge home patient given warnings.  Patient is EKG sinus tachycardia with an AV some ST segment elevation suggesting acute pericarditis.  But previous EKG was somewhat similar.  And without any chest pain.  No murmurs. Final Clinical Impression(s) /  ED Diagnoses Final diagnoses:  Accidental drug overdose, initial encounter    Rx / DC Orders ED Discharge Orders     None        Vanetta Mulders, MD 03/12/21 2212

## 2021-03-12 NOTE — ED Notes (Signed)
edp in room  

## 2021-03-12 NOTE — ED Notes (Signed)
Pt pacing. Ready to be d/c

## 2021-03-12 NOTE — ED Notes (Signed)
Patient given unknown amount of IM Narcan via family and 2mg  IN via fire department.

## 2021-03-12 NOTE — ED Notes (Signed)
Gave pt ice water- tolerating po

## 2021-03-12 NOTE — Discharge Instructions (Addendum)
Urine drug screen was negative for opiates.  But she did wake up with Narcan suggesting that maybe fentanyl was in your system.  These types of drugs can be very dangerous.  Return for any new or worse symptoms.

## 2021-03-12 NOTE — ED Triage Notes (Signed)
Patient states he he though he was taking 30mg  roxicodone and family states patient passed out and per fire station oxygen saturation 86% and patient given BVM. Patient alert & oriented x4. Patient has hx of heroin use with rehab stint. Patient cooperative and calm at this time. Denies SI/HI.
